# Patient Record
Sex: Male | Born: 1995 | Race: White | Hispanic: No | Marital: Single
Health system: Southern US, Community
[De-identification: ages and names within clinical notes are randomized; demographics above are authoritative.]

---
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
## 2015-03-10 IMAGING — US US SCROTUM
1 series · 14 of 25 positions shown · non-contrast
Comparison: None.

CLINICAL DATA: Question testicular torsion.

EXAM:
SCROTAL ULTRASOUND
DOPPLER ULTRASOUND OF THE TESTICLES
TECHNIQUE: Complete ultrasound examination of the testicles, epididymis, and
other scrotal structures was performed. Color and spectral Doppler
ultrasound were also utilized to evaluate blood flow to the
testicles.

[Series 1: us scrotum · 0.07mm/px · 14 of 44 slices shown]
[im 1/44]
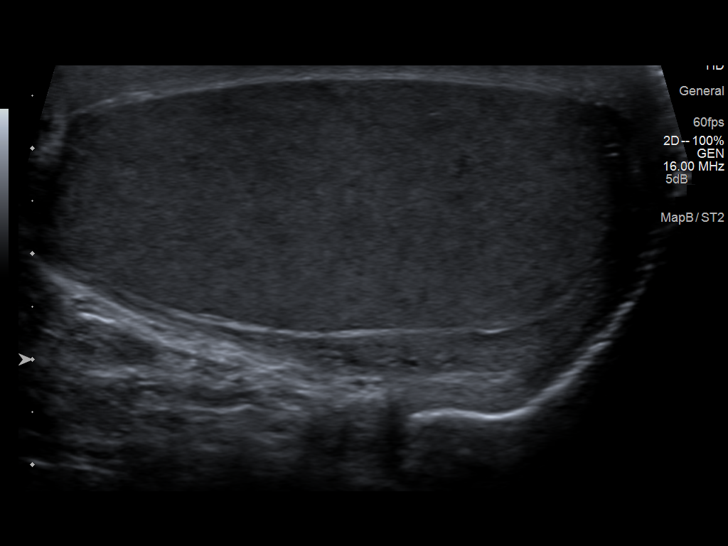
[im 4/44]
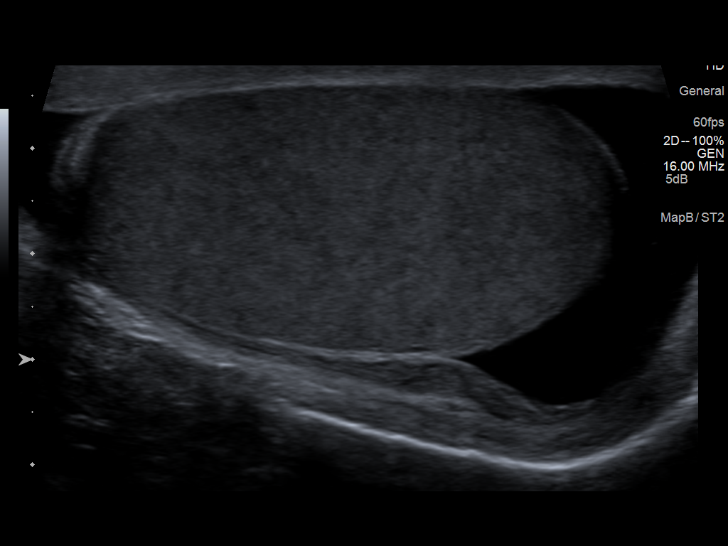
[im 8/44]
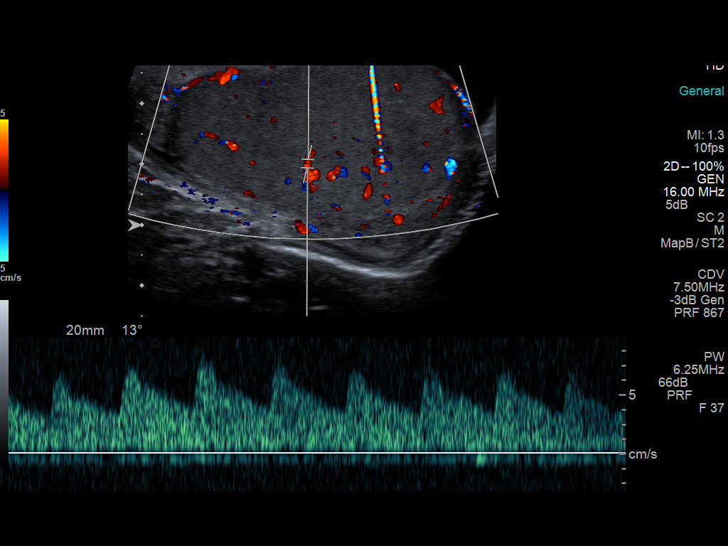
[im 11/44]
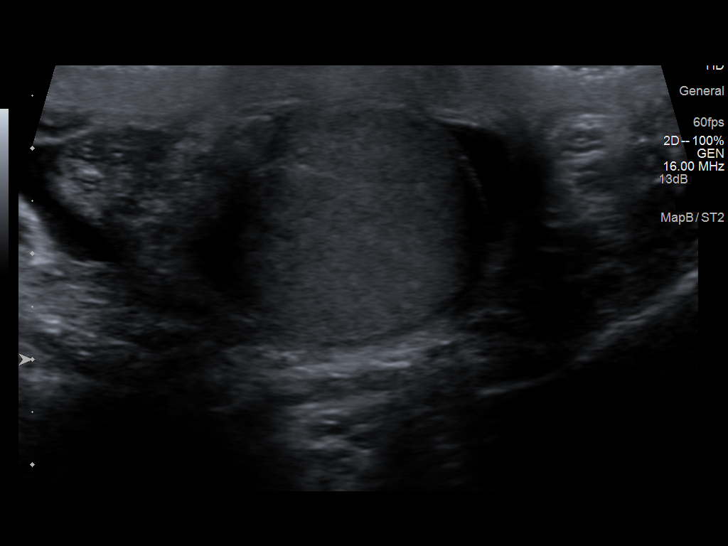
[im 15/44]
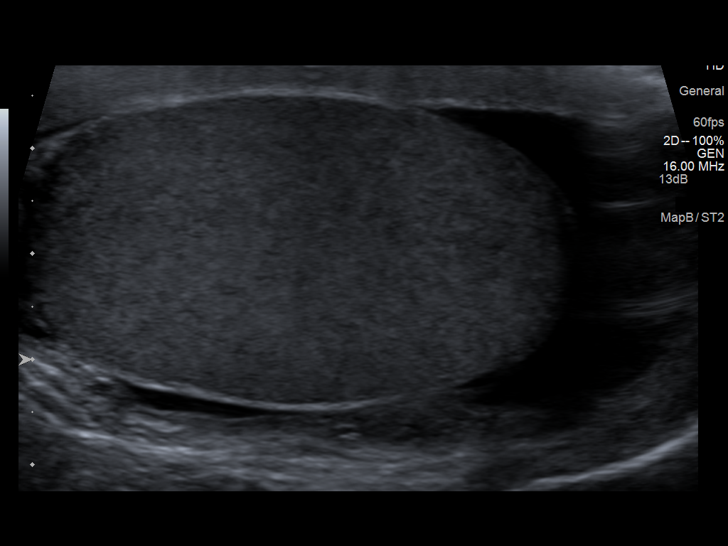
[im 17/44]
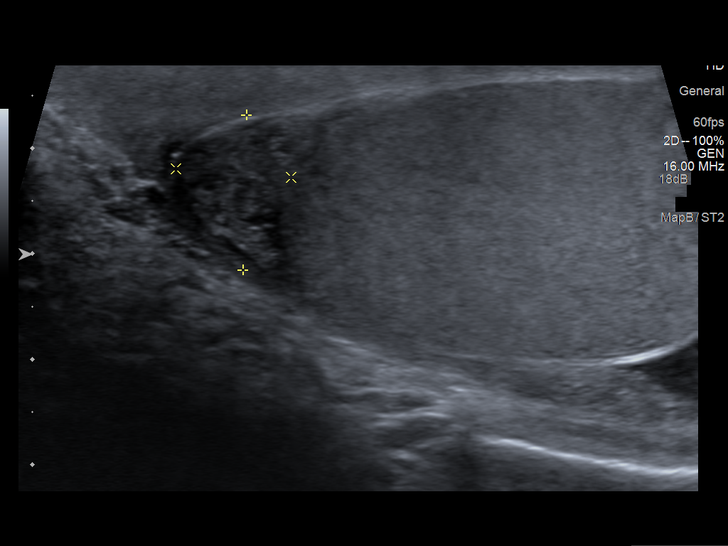
[im 20/44]
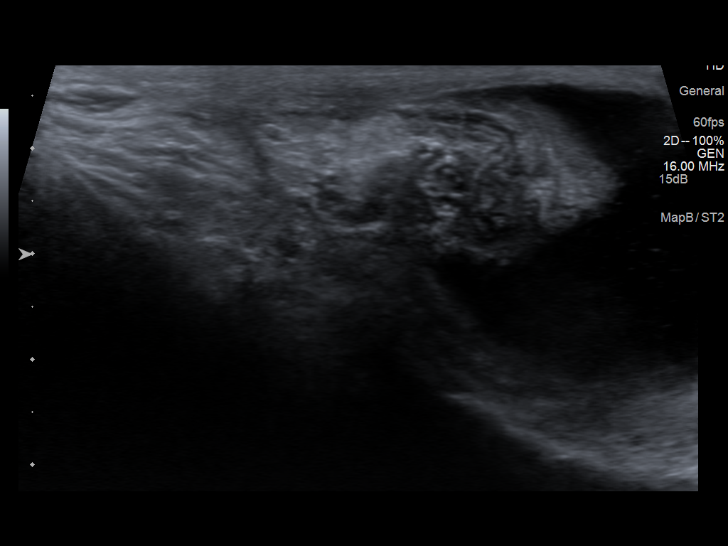
[im 24/44]
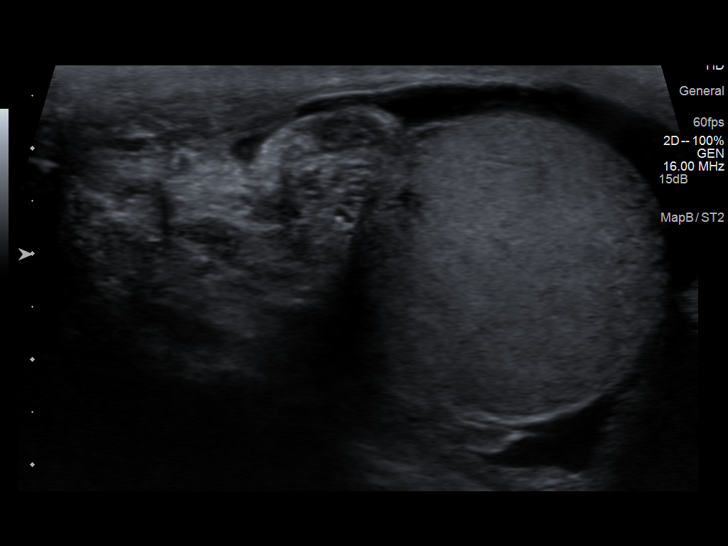
[im 27/44]
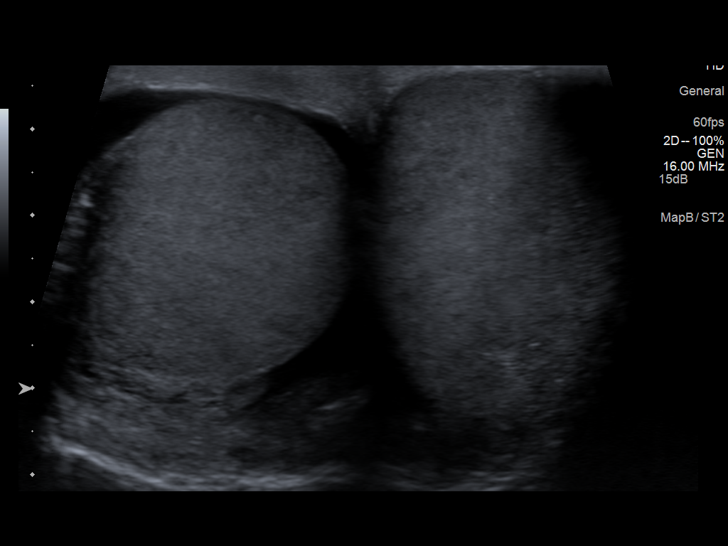
[im 29/44]
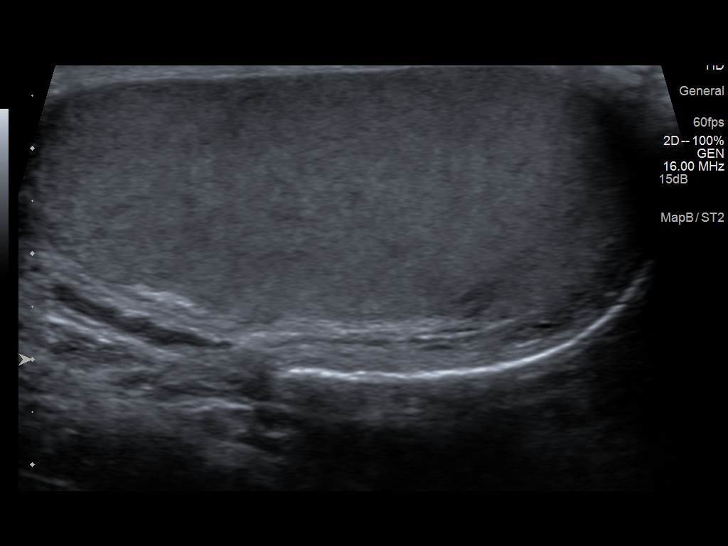
[im 33/44]
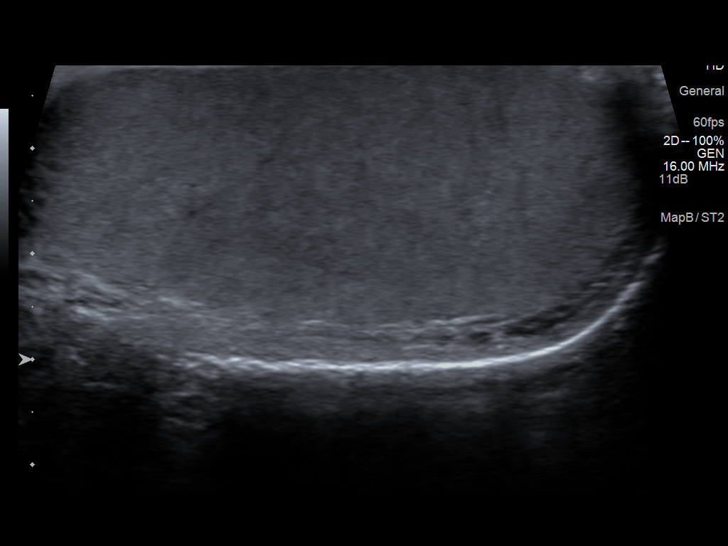
[im 36/44]
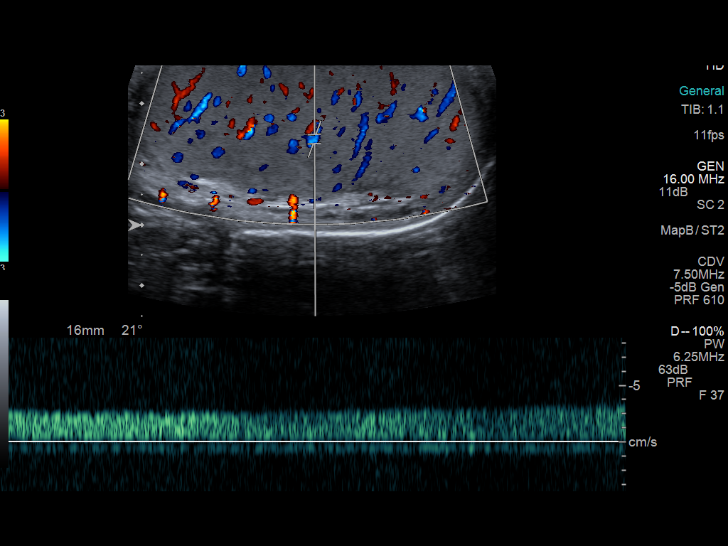
[im 40/44]
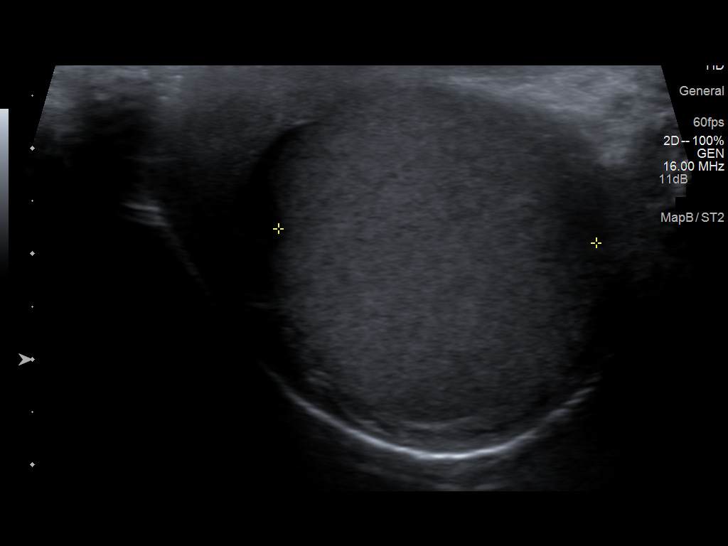
[im 44/44]
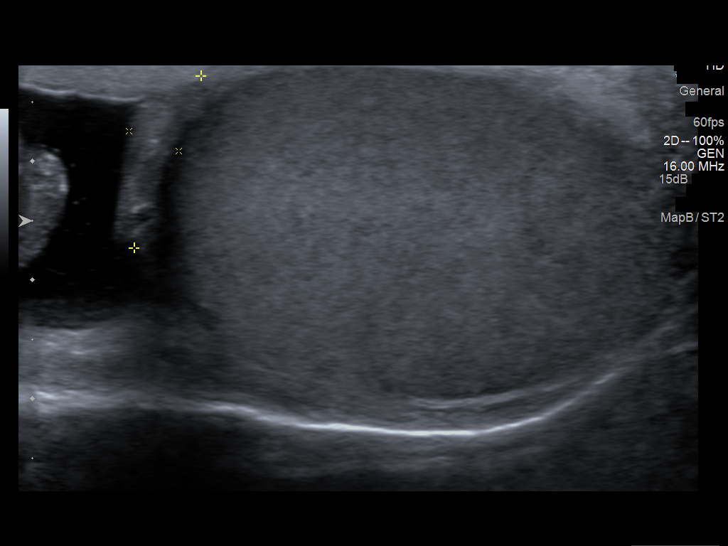

[14 of 25 positions shown; findings below may reference images not displayed]

FINDINGS: Right testicle

Measurements: 5.4 x 2.4 x 3.2 cm. No mass or microlithiasis
visualized.

Left testicle

Measurements: 5.6 x 2.4 x 3.0 cm. No mass or microlithiasis
visualized.

Right epididymis:  Normal in size and appearance.

Left epididymis:  Normal in size and appearance.

Hydrocele:  Small right hydrocele

Varicocele:  None visualized.

Pulsed Doppler interrogation of both testes demonstrates low
resistance arterial and venous waveforms bilaterally.

Heterogeneous soft tissue noted in the region of the right inguinal
canal, question right inguinal hernia.
IMPRESSION: No evidence of testicular torsion. No evidence of orchitis or
epididymitis.

Small right hydrocele.

Question right inguinal hernia.

## 2018-10-01 ENCOUNTER — Ambulatory Visit: Payer: Self-pay | Admitting: Emergency Medicine
